# Patient Record
Sex: Female | Born: 2000 | Race: Black or African American | Hispanic: No | Marital: Single | Smoking: Current every day smoker
Health system: Southern US, Community
[De-identification: ages and names within clinical notes are randomized; demographics above are authoritative.]

## PROBLEM LIST (undated history)

## (undated) DIAGNOSIS — Z9109 Other allergy status, other than to drugs and biological substances: Secondary | ICD-10-CM

---
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
## 2020-04-25 ENCOUNTER — Other Ambulatory Visit: Payer: Self-pay

## 2020-04-25 ENCOUNTER — Encounter (HOSPITAL_COMMUNITY): Payer: Self-pay | Admitting: Emergency Medicine

## 2020-04-25 ENCOUNTER — Emergency Department (HOSPITAL_COMMUNITY)
Admission: EM | Admit: 2020-04-25 | Discharge: 2020-04-25 | Disposition: A | Discharge status: Home / Self Care | Payer: No Typology Code available for payment source | Attending: Emergency Medicine | Admitting: Emergency Medicine

## 2020-04-25 DIAGNOSIS — F17210 Nicotine dependence, cigarettes, uncomplicated: Secondary | ICD-10-CM | POA: Diagnosis not present

## 2020-04-25 DIAGNOSIS — R519 Headache, unspecified: Secondary | ICD-10-CM | POA: Insufficient documentation

## 2020-04-25 DIAGNOSIS — R55 Syncope and collapse: Secondary | ICD-10-CM | POA: Insufficient documentation

## 2020-04-25 LAB — BASIC METABOLIC PANEL
Anion gap: 11 (ref 5–15)
BUN: 11 mg/dL (ref 6–20)
CO2: 19 mmol/L — ABNORMAL LOW (ref 22–32)
Calcium: 9 mg/dL (ref 8.9–10.3)
Chloride: 108 mmol/L (ref 98–111)
Creatinine, Ser: 0.55 mg/dL (ref 0.44–1.00)
GFR calc Af Amer: >60 mL/min (ref >60)
GFR calc non Af Amer: >60 mL/min (ref >60)
Glucose, Bld: 86 mg/dL (ref 70–99)
Potassium: 3.8 mmol/L (ref 3.5–5.1)
Sodium: 138 mmol/L (ref 135–145)

## 2020-04-25 LAB — CBC
HCT: 37.7 % (ref 36.0–46.0)
Hemoglobin: 10.6 g/dL — ABNORMAL LOW (ref 12.0–15.0)
MCH: 24.5 pg — ABNORMAL LOW (ref 26.0–34.0)
MCHC: 28.1 g/dL — ABNORMAL LOW (ref 30.0–36.0)
MCV: 87.3 fL (ref 80.0–100.0)
Platelets: 312 10*3/uL (ref 150–400)
RBC: 4.32 MIL/uL (ref 3.87–5.11)
RDW: 18.6 % — ABNORMAL HIGH (ref 11.5–15.5)
WBC: 6.4 10*3/uL (ref 4.0–10.5)
nRBC: 0 % (ref 0.0–0.2)

## 2020-04-25 LAB — URINALYSIS, ROUTINE W REFLEX MICROSCOPIC
Bilirubin Urine: NEGATIVE
Glucose, UA: NEGATIVE mg/dL
Hgb urine dipstick: NEGATIVE
Ketones, ur: 5 mg/dL — AB
Leukocytes,Ua: NEGATIVE
Nitrite: NEGATIVE
Protein, ur: NEGATIVE mg/dL
Specific Gravity, Urine: 1.026 (ref 1.005–1.030)
pH: 6 (ref 5.0–8.0)

## 2020-04-25 LAB — CBG MONITORING, ED: Glucose-Capillary: 75 mg/dL (ref 70–99)

## 2020-04-25 LAB — HCG, QUANTITATIVE, PREGNANCY: hCG, Beta Chain, Quant, S: <1 m[IU]/mL (ref <5)

## 2020-04-25 MED ORDER — METOCLOPRAMIDE HCL 5 MG/ML IJ SOLN
10.0000 mg | Freq: Once | INTRAMUSCULAR | Status: AC
  Administered 2020-04-25: 21:00:00 10 mg via INTRAVENOUS
  Filled 2020-04-25: qty 2

## 2020-04-25 MED ORDER — DIPHENHYDRAMINE HCL 50 MG/ML IJ SOLN
25.0000 mg | Freq: Once | INTRAMUSCULAR | Status: AC
  Administered 2020-04-25: 21:00:00 25 mg via INTRAVENOUS
  Filled 2020-04-25: qty 1

## 2020-04-25 MED ORDER — SODIUM CHLORIDE 0.9 % IV BOLUS
1000.0000 mL | Freq: Once | INTRAVENOUS | Status: AC
  Administered 2020-04-25: 21:00:00 1000 mL via INTRAVENOUS

## 2020-04-25 NOTE — ED Triage Notes (Signed)
Patient reports headache today with LOC at restaurant. Denies fall. Denies N/V/D.

## 2020-04-25 NOTE — Discharge Instructions (Addendum)
It was our pleasure to provide your ER care today - we hope that you feel better.  Rest. Drink plenty of fluids.   Take acetaminophen as need.   Follow up with primary care doctor in the coming week.  Return to ER if worse, new symptoms, fevers, new or severe pain, persistent vomiting, trouble breathing, weak/fainting, or other concern.

## 2020-04-25 NOTE — ED Provider Notes (Signed)
[facility]-EMERGENCY DEPT Provider Note   CSN: [id number] Arrival date & time: 04/25/20  1641     History Chief Complaint  Patient presents with  . Loss of Consciousness  . Headache    Katherine Dawson is a 19 y.o. female who presents to ED with a chief complaint of syncope and headache.  States that she was at [employer] with her family.  When she was standing up at the bar, felt like she suddenly was warm, felt lightheaded.  States that the woman next to her was asking her if she was okay.  She believes that she passed out for several seconds as she woke up with the woman holding on to her.  She states that she did not fall to the floor.  Since then she has had a headache.  She reports history of headaches in the past when she is overworked.  She had normal meal today.  She denies any new medications or changes to her lifestyle.  Denies history of similar symptoms in the past.  She has not taken any medications to help with her headache.  She denies any vision changes, numbness in arms or legs, vomiting, diarrhea, chest pain, shortness of breath, leg swelling, recent immobilization, personal or family history of aneurysms.  HPI     Past Medical History:  Diagnosis Date  . Pollen allergies     There are no problems to display for this patient.   History reviewed. No pertinent surgical history.   OB History   No obstetric history on file.     No family history on file.  Social History   Tobacco Use  . Smoking status: Current Every Day Smoker    Types: Cigarettes  . Smokeless tobacco: Never Used  Vaping Use  . Vaping Use: Never used  Substance Use Topics  . Alcohol use: No  . Drug use: No    Home Medications Prior to Admission medications   Medication Sig Start Date End Date Taking? Authorizing Provider  diphenhydrAMINE (BENADRYL) 25 MG tablet Take 1 tablet (25 mg total) by mouth every 6 (six) hours. 06/26/18   Roxy Horseman, PA-C    famotidine (PEPCID) 20 MG tablet Take 1 tablet (20 mg total) by mouth 2 (two) times daily. 06/26/18   Roxy Horseman, PA-C    Allergies    Patient has no known allergies.  Review of Systems   Review of Systems  Constitutional: Negative for appetite change, chills and fever.  HENT: Negative for ear pain, rhinorrhea, sneezing and sore throat.   Eyes: Negative for photophobia and visual disturbance.  Respiratory: Negative for cough, chest tightness, shortness of breath and wheezing.   Cardiovascular: Negative for chest pain and palpitations.  Gastrointestinal: Negative for abdominal pain, blood in stool, constipation, diarrhea, nausea and vomiting.  Genitourinary: Negative for dysuria, hematuria and urgency.  Musculoskeletal: Negative for myalgias.  Skin: Negative for rash.  Neurological: Positive for syncope, light-headedness and headaches. Negative for dizziness and weakness.    Physical Exam Updated Vital Signs BP 122/65 (BP Location: Right Arm)   Pulse 74   Temp 97.9 F (36.6 C) (Oral)   Resp 18   LMP 04/11/2020   SpO2 100%   Physical Exam Vitals and nursing note reviewed.  Constitutional:      General: She is not in acute distress.    Appearance: She is well-developed.  HENT:     Head: Normocephalic and atraumatic.     Nose: Nose normal.  Eyes:     General: No scleral icterus.       Right eye: No discharge.        Left eye: No discharge.     Conjunctiva/sclera: Conjunctivae normal.     Pupils: Pupils are equal, round, and reactive to light.  Neck:     Comments: No meningeal signs. Cardiovascular:     Rate and Rhythm: Normal rate and regular rhythm.     Heart sounds: Normal heart sounds. No murmur heard.  No friction rub. No gallop.   Pulmonary:     Effort: Pulmonary effort is normal. No respiratory distress.     Breath sounds: Normal breath sounds.  Abdominal:     General: Bowel sounds are normal. There is no distension.     Palpations: Abdomen is soft.      Tenderness: There is no abdominal tenderness. There is no guarding.  Musculoskeletal:        General: Normal range of motion.     Cervical back: Normal range of motion and neck supple.     Comments: No lower extremity edema, erythema or calf tenderness bilaterally.  Skin:    General: Skin is warm and dry.     Findings: No rash.  Neurological:     General: No focal deficit present.     Mental Status: She is alert and oriented to person, place, and time.     Cranial Nerves: No cranial nerve deficit.     Sensory: No sensory deficit.     Motor: No weakness or abnormal muscle tone.     Coordination: Coordination normal.     Comments: Pupils reactive. No facial asymmetry noted. Cranial nerves appear grossly intact. Sensation intact to light touch on face, BUE and BLE. Strength 5/5 in BUE and BLE. Normal finger to nose coordination bilaterally.     ED Results / Procedures / Treatments   Labs (all labs ordered are listed, but only abnormal results are displayed) Labs Reviewed  BASIC METABOLIC PANEL - Abnormal; Notable for the following components:      Result Value   CO2 19 (*)    All other components within normal limits  CBC - Abnormal; Notable for the following components:   Hemoglobin 10.6 (*)    MCH 24.5 (*)    MCHC 28.1 (*)    RDW 18.6 (*)    All other components within normal limits  HCG, QUANTITATIVE, PREGNANCY  URINALYSIS, ROUTINE W REFLEX MICROSCOPIC  CBG MONITORING, ED  CBG MONITORING, ED    EKG None  Radiology No results found.  Procedures Procedures (including critical care time)  Medications Ordered in ED Medications  sodium chloride 0.9 % bolus 1,000 mL (1,000 mLs Intravenous New Bag/Given 04/25/20 2040)  metoCLOPramide (REGLAN) injection 10 mg (10 mg Intravenous Given 04/25/20 2041)  diphenhydrAMINE (BENADRYL) injection 25 mg (25 mg Intravenous Given 04/25/20 2041)    ED Course  I have reviewed the triage vital signs and the nursing notes.  Pertinent  labs & imaging results that were available during my care of the patient were reviewed by me and considered in my medical decision making (see chart for details).    MDM Rules/Calculators/A&P                          18yo F presents to the ED with a syncopal episode for a few seconds with a subsequent headache that occurred prior to arrival.  She was eating lunch with her  family when the episode happened.  States that she did not hit the floor.  She has not tried anything for her headache. On exam she is overall well-appearing.  No deficits neurological exam noted.  No numbness or weakness noted, no facial asymmetry noted.  No meningeal signs noted.  No lower extremity edema, erythema or calf tenderness do not concern me for DVT. EKG shows sinus rhythm. Labwork ordered and interpreted; CBC with hemoglobin at baseline.  BMP is unremarkable.  hCG is negative.  Will give IV fluids, migraine cocktail and reassess.  Discussed possibility of imaging with patient but she states that she would like to see how she feels after treatment.  Care handed off to Dr. Ashok Cordia pending recheck after medications. Patient will likely be discharged home if her symptoms improve and her workup is reassuring.   Portions of this note were generated with [employer]. Dictation errors may occur despite best attempts at proofreading.  Final Clinical Impression(s) / ED Diagnoses Final diagnoses:  Syncope and collapse    Rx / DC Orders ED Discharge Orders    None       Delia Heady, Hershal Coria 04/25/20 2051    Lajean Saver, MD 04/25/20 2200

## 2022-04-17 ENCOUNTER — Emergency Department (HOSPITAL_COMMUNITY): Payer: Self-pay

## 2022-04-17 ENCOUNTER — Encounter (HOSPITAL_COMMUNITY): Payer: Self-pay

## 2022-04-17 ENCOUNTER — Emergency Department (HOSPITAL_COMMUNITY)
Admission: EM | Admit: 2022-04-17 | Discharge: 2022-04-18 | Disposition: A | Discharge status: Home / Self Care | Payer: Self-pay | Attending: Emergency Medicine | Admitting: Emergency Medicine

## 2022-04-17 DIAGNOSIS — R102 Pelvic and perineal pain: Secondary | ICD-10-CM | POA: Insufficient documentation

## 2022-04-17 DIAGNOSIS — N939 Abnormal uterine and vaginal bleeding, unspecified: Secondary | ICD-10-CM | POA: Insufficient documentation

## 2022-04-17 DIAGNOSIS — F17200 Nicotine dependence, unspecified, uncomplicated: Secondary | ICD-10-CM | POA: Insufficient documentation

## 2022-04-17 LAB — COMPREHENSIVE METABOLIC PANEL
ALT: 14 U/L (ref 0–44)
AST: 17 U/L (ref 15–41)
Albumin: 3.7 g/dL (ref 3.5–5.0)
Alkaline Phosphatase: 49 U/L (ref 38–126)
Anion gap: 6 (ref 5–15)
BUN: 6 mg/dL (ref 6–20)
CO2: 22 mmol/L (ref 22–32)
Calcium: 9 mg/dL (ref 8.9–10.3)
Chloride: 105 mmol/L (ref 98–111)
Creatinine, Ser: 0.59 mg/dL (ref 0.44–1.00)
GFR, Estimated: >60 mL/min (ref >60)
Glucose, Bld: 97 mg/dL (ref 70–99)
Potassium: 3.6 mmol/L (ref 3.5–5.1)
Sodium: 133 mmol/L — ABNORMAL LOW (ref 135–145)
Total Bilirubin: 0.4 mg/dL (ref 0.3–1.2)
Total Protein: 7.7 g/dL (ref 6.5–8.1)

## 2022-04-17 LAB — CBC
HCT: 33.3 % — ABNORMAL LOW (ref 36.0–46.0)
Hemoglobin: 10.4 g/dL — ABNORMAL LOW (ref 12.0–15.0)
MCH: 26.1 pg (ref 26.0–34.0)
MCHC: 31.2 g/dL (ref 30.0–36.0)
MCV: 83.7 fL (ref 80.0–100.0)
Platelets: 307 10*3/uL (ref 150–400)
RBC: 3.98 MIL/uL (ref 3.87–5.11)
RDW: 16.7 % — ABNORMAL HIGH (ref 11.5–15.5)
WBC: 7.3 10*3/uL (ref 4.0–10.5)
nRBC: 0 % (ref 0.0–0.2)

## 2022-04-17 LAB — I-STAT BETA HCG BLOOD, ED (MC, WL, AP ONLY): I-stat hCG, quantitative: <5 m[IU]/mL (ref <5)

## 2022-04-17 NOTE — ED Provider Triage Note (Signed)
Emergency Medicine Provider Triage Evaluation Note  Katherine Dawson , a 21 y.o. female  was evaluated in triage.  Pt complains of vaginal bleeding x 2 weeks. Going through pad every hour. Associated pelvic pain. LMP about 1 month ago. .  Review of Systems  Positive: Vaginal bleeding, pelvic pain Negative: Fever, vomiting, dysuria  Physical Exam  BP 131/76 (BP Location: Right Arm)   Pulse 75   Temp 98.3 F (36.8 C) (Oral)   Resp 18   LMP 04/17/2022   SpO2 97%  Gen:   Awake, no distress   Resp:  Normal effort  MSK:   Moves extremities without difficulty  Other:  No peritoneal signs.   Medical Decision Making  Medically screening exam initiated at 10:25 PM.  Appropriate orders placed.  Katherine Dawson was informed that the remainder of the evaluation will be completed by another provider, this initial triage assessment does not replace that evaluation, and the importance of remaining in the ED until their evaluation is complete.  Vaginal bleeding.    Katherine Dawson, [place] 04/17/22 2226

## 2022-04-17 NOTE — ED Triage Notes (Signed)
Pt reports heavy bleeding & abd pain. Pt reports the bleeding started x2 weeks ago. Pt reports she went to health department and they recommend she comes to the ER

## 2022-04-18 LAB — URINALYSIS, ROUTINE W REFLEX MICROSCOPIC
Bacteria, UA: NONE SEEN
Bilirubin Urine: NEGATIVE
Glucose, UA: NEGATIVE mg/dL
Ketones, ur: NEGATIVE mg/dL
Leukocytes,Ua: NEGATIVE
Nitrite: NEGATIVE
Protein, ur: NEGATIVE mg/dL
Specific Gravity, Urine: 1.017 (ref 1.005–1.030)
pH: 6 (ref 5.0–8.0)

## 2022-04-18 LAB — HEMOGLOBIN AND HEMATOCRIT, BLOOD
HCT: 33.4 % — ABNORMAL LOW (ref 36.0–46.0)
Hemoglobin: 10.4 g/dL — ABNORMAL LOW (ref 12.0–15.0)

## 2022-04-18 MED ORDER — SODIUM CHLORIDE 0.9 % IV BOLUS
1000.0000 mL | Freq: Once | INTRAVENOUS | Status: AC
  Administered 2022-04-18: 1000 mL via INTRAVENOUS

## 2022-04-18 MED ORDER — SODIUM CHLORIDE 0.9 % IV SOLN: INTRAVENOUS | Status: DC

## 2022-04-18 NOTE — ED Provider Notes (Signed)
[facility] Provider Note   CSN: [id number] Arrival date & time: 04/17/22  2214     History  Chief Complaint  Patient presents with   Abdominal Pain    Katherine Dawson is a 21 y.o. female.  Patient been having heavy vaginal bleeding for about 2 weeks was been heavier for about a week with clots.  She went to health department they recommend she come to the emergency department.  Patient has an implanted birth control.  Patient has not had difficulties with heavy bleeding in the past.  Patient without any lightheadedness or feeling like she is going to pass out no shortness of breath no chest pain.  Past medical history negative patient is an everyday smoker.       Home Medications Prior to Admission medications   Medication Sig Start Date End Date Taking? Authorizing Provider  acetaminophen (TYLENOL) 500 MG tablet Take 1,000 mg by mouth every 6 (six) hours as needed for moderate pain or headache.   Yes [provider]  diphenhydrAMINE (BENADRYL) 25 MG tablet Take 1 tablet (25 mg total) by mouth every 6 (six) hours. Patient not taking: Reported on 04/18/2022 06/26/18   Roxy HorsemanBrowning, Robert, PA-C  famotidine (PEPCID) 20 MG tablet Take 1 tablet (20 mg total) by mouth 2 (two) times daily. Patient not taking: Reported on 04/18/2022 06/26/18   Roxy HorsemanBrowning, Robert, PA-C      Allergies    Patient has no known allergies.    Review of Systems   Review of Systems  Constitutional:  Negative for chills and fever.  HENT:  Negative for ear pain and sore throat.   Eyes:  Negative for pain and visual disturbance.  Respiratory:  Negative for cough and shortness of breath.   Cardiovascular:  Negative for chest pain and palpitations.  Gastrointestinal:  Negative for abdominal pain and vomiting.  Genitourinary:  Positive for vaginal bleeding. Negative for dysuria and hematuria.  Musculoskeletal:  Negative for arthralgias and back pain.  Skin:  Negative  for color change and rash.  Neurological:  Negative for seizures and syncope.  All other systems reviewed and are negative.   Physical Exam Updated Vital Signs BP 107/65   Pulse 77   Temp 98.3 F (36.8 C)   Resp 16   LMP 04/17/2022   SpO2 100%  Physical Exam Vitals and nursing note reviewed.  Constitutional:      General: She is not in acute distress.    Appearance: She is well-developed.  HENT:     Head: Normocephalic and atraumatic.  Eyes:     Conjunctiva/sclera: Conjunctivae normal.  Cardiovascular:     Rate and Rhythm: Normal rate and regular rhythm.     Heart sounds: No murmur heard. Pulmonary:     Effort: Pulmonary effort is normal. No respiratory distress.     Breath sounds: Normal breath sounds.  Abdominal:     General: Bowel sounds are normal.     Palpations: Abdomen is soft.     Tenderness: There is no abdominal tenderness.     Hernia: No hernia is present.  Musculoskeletal:        General: No swelling.     Cervical back: Neck supple.  Skin:    General: Skin is warm and dry.     Capillary Refill: Capillary refill takes less than 2 seconds.  Neurological:     General: No focal deficit present.     Mental Status: She is alert.  Psychiatric:  Mood and Affect: Mood normal.     ED Results / Procedures / Treatments   Labs (all labs ordered are listed, but only abnormal results are displayed) Labs Reviewed  COMPREHENSIVE METABOLIC PANEL - Abnormal; Notable for the following components:      Result Value   Sodium 133 (*)    All other components within normal limits  CBC - Abnormal; Notable for the following components:   Hemoglobin 10.4 (*)    HCT 33.3 (*)    RDW 16.7 (*)    All other components within normal limits  URINALYSIS, ROUTINE W REFLEX MICROSCOPIC - Abnormal; Notable for the following components:   Hgb urine dipstick SMALL (*)    All other components within normal limits  HEMOGLOBIN AND HEMATOCRIT, BLOOD - Abnormal; Notable for the  following components:   Hemoglobin 10.4 (*)    HCT 33.4 (*)    All other components within normal limits  I-STAT BETA HCG BLOOD, ED ([facility], [facility], [facility] ONLY)    EKG None  Radiology US Pelvis Complete  Result Date: 04/18/2022 CLINICAL DATA:  Initial evaluation for acute pelvic pain, vaginal bleeding. EXAM: TRANSABDOMINAL AND TRANSVAGINAL ULTRASOUND OF PELVIS DOPPLER ULTRASOUND OF OVARIES TECHNIQUE: Both transabdominal and transvaginal ultrasound examinations of the pelvis were performed. Transabdominal technique was performed for global imaging of the pelvis including uterus, ovaries, adnexal regions, and pelvic cul-de-sac. It was necessary to proceed with endovaginal exam following the transabdominal exam to visualize the endometrium and ovaries. Color and duplex Doppler ultrasound was utilized to evaluate blood flow to the ovaries. COMPARISON:  None Available. FINDINGS: Uterus Measurements: 6.6 x 3.1 x 3.9 cm = volume: 42.6 mL. Uterus is anteverted. No discrete fibroid or other mass. Increased vascularity seen diffusely throughout the myometrium. Endometrium Thickness: 2.2 mm.  No focal abnormality visualized. Right ovary Measurements: 3.3 x 2.3 x 2.6 cm = volume: 10.3 mL. Normal appearance/no adnexal mass. Left ovary Measurements: 3.2 x 2.0 x 2.5 cm = volume: 8.1 mL. Normal appearance/no adnexal mass. Pulsed Doppler evaluation of both ovaries demonstrates normal low-resistance arterial and venous waveforms. Other findings No abnormal free fluid. IMPRESSION: 1. Negative pelvic ultrasound with no evidence for torsion or other acute abnormality. 2. Endometrial stripe within normal limits measuring 2.2 mm in thickness. If bleeding remains unresponsive to hormonal or medical therapy, sonohysterogram should be considered for focal lesion work-up. (Ref: Radiological Reasoning: Algorithmic Workup of Abnormal Vaginal Bleeding with Endovaginal Sonography and Sonohysterography. AJR 2008; 785:Y85-02). Electronically  Signed   By: Rise Mu M.D.   On: 04/18/2022 00:35   US Transvaginal Non-OB  Result Date: 04/18/2022 CLINICAL DATA:  Initial evaluation for acute pelvic pain, vaginal bleeding. EXAM: TRANSABDOMINAL AND TRANSVAGINAL ULTRASOUND OF PELVIS DOPPLER ULTRASOUND OF OVARIES TECHNIQUE: Both transabdominal and transvaginal ultrasound examinations of the pelvis were performed. Transabdominal technique was performed for global imaging of the pelvis including uterus, ovaries, adnexal regions, and pelvic cul-de-sac. It was necessary to proceed with endovaginal exam following the transabdominal exam to visualize the endometrium and ovaries. Color and duplex Doppler ultrasound was utilized to evaluate blood flow to the ovaries. COMPARISON:  None Available. FINDINGS: Uterus Measurements: 6.6 x 3.1 x 3.9 cm = volume: 42.6 mL. Uterus is anteverted. No discrete fibroid or other mass. Increased vascularity seen diffusely throughout the myometrium. Endometrium Thickness: 2.2 mm.  No focal abnormality visualized. Right ovary Measurements: 3.3 x 2.3 x 2.6 cm = volume: 10.3 mL. Normal appearance/no adnexal mass. Left ovary Measurements: 3.2 x 2.0 x 2.5 cm = volume:  8.1 mL. Normal appearance/no adnexal mass. Pulsed Doppler evaluation of both ovaries demonstrates normal low-resistance arterial and venous waveforms. Other findings No abnormal free fluid. IMPRESSION: 1. Negative pelvic ultrasound with no evidence for torsion or other acute abnormality. 2. Endometrial stripe within normal limits measuring 2.2 mm in thickness. If bleeding remains unresponsive to hormonal or medical therapy, sonohysterogram should be considered for focal lesion work-up. (Ref: Radiological Reasoning: Algorithmic Workup of Abnormal Vaginal Bleeding with Endovaginal Sonography and Sonohysterography. AJR 2008; 098:J19-14). Electronically Signed   By: Rise Mu M.D.   On: 04/18/2022 00:35   [place] Art/Ven Flow Abd Pelv Doppler  Result Date:  04/18/2022 CLINICAL DATA:  Initial evaluation for acute pelvic pain, vaginal bleeding. EXAM: TRANSABDOMINAL AND TRANSVAGINAL ULTRASOUND OF PELVIS DOPPLER ULTRASOUND OF OVARIES TECHNIQUE: Both transabdominal and transvaginal ultrasound examinations of the pelvis were performed. Transabdominal technique was performed for global imaging of the pelvis including uterus, ovaries, adnexal regions, and pelvic cul-de-sac. It was necessary to proceed with endovaginal exam following the transabdominal exam to visualize the endometrium and ovaries. Color and duplex Doppler ultrasound was utilized to evaluate blood flow to the ovaries. COMPARISON:  None Available. FINDINGS: Uterus Measurements: 6.6 x 3.1 x 3.9 cm = volume: 42.6 mL. Uterus is anteverted. No discrete fibroid or other mass. Increased vascularity seen diffusely throughout the myometrium. Endometrium Thickness: 2.2 mm.  No focal abnormality visualized. Right ovary Measurements: 3.3 x 2.3 x 2.6 cm = volume: 10.3 mL. Normal appearance/no adnexal mass. Left ovary Measurements: 3.2 x 2.0 x 2.5 cm = volume: 8.1 mL. Normal appearance/no adnexal mass. Pulsed Doppler evaluation of both ovaries demonstrates normal low-resistance arterial and venous waveforms. Other findings No abnormal free fluid. IMPRESSION: 1. Negative pelvic ultrasound with no evidence for torsion or other acute abnormality. 2. Endometrial stripe within normal limits measuring 2.2 mm in thickness. If bleeding remains unresponsive to hormonal or medical therapy, sonohysterogram should be considered for focal lesion work-up. (Ref: Radiological Reasoning: Algorithmic Workup of Abnormal Vaginal Bleeding with Endovaginal Sonography and Sonohysterography. AJR 2008; 782:N56-21). Electronically Signed   By: Rise Mu M.D.   On: 04/18/2022 00:35    Procedures Procedures    Medications Ordered in ED Medications  0.9 %  sodium chloride infusion (has no administration in time range)  sodium  chloride 0.9 % bolus 1,000 mL (1,000 mLs Intravenous New Bag/Given 04/18/22 3086)    ED Course/ Medical Decision Making/ A&P                           Medical Decision Making Amount and/or Complexity of Data Reviewed Labs: ordered.  Risk Prescription drug management.   Patient pregnancy test negative.  Urinalysis not consistent with urinary tract infection.  Complete metabolic panel normal including liver function test.  Patient CBC which was done last evening had a hemoglobin of 10.4 white count 7.3 platelets are normal.  Patient had ultrasound done of the pelvis and also transvaginal ultrasound which was negative with no evidence of torsion no acute abnormality.  Endometrial stripe was within normal limits measuring 2.2 mm in thickness.  Their recommendation if bleeding remains unresponsive to hormonal or medical therapy that a should be considered for focal lesion work-up.  Patient nontoxic no acute distress.  No tachycardia blood pressures were a little on the soft side initially she came in at 131/76 and then we work on getting 94/53.  Patient when I saw her was like 94/49.  Again no tachycardia.  Patient  given 1 L of lactated Ringer's.  H&H repeated and there was no change in the hemoglobin.  Hemoglobin was still 10.4.  And patient's systolic blood pressures are consistently been over 100.  Patient remains in no acute distress nontoxic.  Patient does not have GYN follow-up.  Patient does not have regular periods because of the implantable birth control.  We will give patient follow-up with OB/GYN.  Final Clinical Impression(s) / ED Diagnoses Final diagnoses:  Excessive vaginal bleeding    Rx / DC Orders ED Discharge Orders     None         Vanetta Mulders, MD 04/18/22 1105

## 2022-04-18 NOTE — Discharge Instructions (Signed)
Make an appointment to follow-up with [facility].  Return for any new or worse symptoms return for feeling like you are going to pass out or feeling lightheaded.  Today's labs without any significant abnormalities.  Pregnancy test negative and hemoglobin has been very stable.

## 2022-04-18 NOTE — ED Notes (Addendum)
Pt seen walking out of lobby with another pt in the lobby. Pt seen headed towards parking lot. Pt informed, multiple times, that if they are not in lobby or in sight of staff when there names are called then they will be pulled OTF.

## 2022-10-13 ENCOUNTER — Emergency Department (HOSPITAL_COMMUNITY)
Admission: EM | Admit: 2022-10-13 | Discharge: 2022-10-13 | Disposition: A | Discharge status: Home / Self Care | Payer: Commercial Managed Care - HMO | Attending: Emergency Medicine | Admitting: Emergency Medicine

## 2022-10-13 ENCOUNTER — Other Ambulatory Visit: Payer: Self-pay

## 2022-10-13 DIAGNOSIS — R1012 Left upper quadrant pain: Secondary | ICD-10-CM | POA: Insufficient documentation

## 2022-10-13 DIAGNOSIS — R109 Unspecified abdominal pain: Secondary | ICD-10-CM

## 2022-10-13 LAB — LIPASE, BLOOD: Lipase: 49 U/L (ref 11–51)

## 2022-10-13 LAB — CBC
HCT: 32.8 % — ABNORMAL LOW (ref 36.0–46.0)
Hemoglobin: 10.4 g/dL — ABNORMAL LOW (ref 12.0–15.0)
MCH: 26.3 pg (ref 26.0–34.0)
MCHC: 31.7 g/dL (ref 30.0–36.0)
MCV: 82.8 fL (ref 80.0–100.0)
Platelets: 231 10*3/uL (ref 150–400)
RBC: 3.96 MIL/uL (ref 3.87–5.11)
RDW: 17.4 % — ABNORMAL HIGH (ref 11.5–15.5)
WBC: 6.6 10*3/uL (ref 4.0–10.5)
nRBC: 0 % (ref 0.0–0.2)

## 2022-10-13 LAB — URINALYSIS, ROUTINE W REFLEX MICROSCOPIC
Bilirubin Urine: NEGATIVE
Glucose, UA: NEGATIVE mg/dL
Hgb urine dipstick: NEGATIVE
Ketones, ur: NEGATIVE mg/dL
Leukocytes,Ua: NEGATIVE
Nitrite: NEGATIVE
Protein, ur: NEGATIVE mg/dL
Specific Gravity, Urine: 1.019 (ref 1.005–1.030)
pH: 5 (ref 5.0–8.0)

## 2022-10-13 LAB — COMPREHENSIVE METABOLIC PANEL
ALT: 12 U/L (ref 0–44)
AST: 16 U/L (ref 15–41)
Albumin: 3.8 g/dL (ref 3.5–5.0)
Alkaline Phosphatase: 47 U/L (ref 38–126)
Anion gap: 7 (ref 5–15)
BUN: 14 mg/dL (ref 6–20)
CO2: 23 mmol/L (ref 22–32)
Calcium: 9.3 mg/dL (ref 8.9–10.3)
Chloride: 106 mmol/L (ref 98–111)
Creatinine, Ser: 0.65 mg/dL (ref 0.44–1.00)
GFR, Estimated: >60 mL/min (ref >60)
Glucose, Bld: 91 mg/dL (ref 70–99)
Potassium: 4.3 mmol/L (ref 3.5–5.1)
Sodium: 136 mmol/L (ref 135–145)
Total Bilirubin: 0.3 mg/dL (ref 0.3–1.2)
Total Protein: 7.1 g/dL (ref 6.5–8.1)

## 2022-10-13 LAB — I-STAT BETA HCG BLOOD, ED (MC, WL, AP ONLY): I-stat hCG, quantitative: <5 m[IU]/mL (ref <5)

## 2022-10-13 MED ORDER — ALUM & MAG HYDROXIDE-SIMETH 200-200-20 MG/5ML PO SUSP
30.0000 mL | Freq: Once | ORAL | Status: AC
  Administered 2022-10-13: 30 mL via ORAL
  Filled 2022-10-13: qty 30

## 2022-10-13 MED ORDER — HYOSCYAMINE SULFATE 0.125 MG SL SUBL
0.1250 mg | SUBLINGUAL_TABLET | Freq: Once | SUBLINGUAL | Status: AC
  Administered 2022-10-13: 0.125 mg via ORAL
  Filled 2022-10-13: qty 1

## 2022-10-13 MED ORDER — LIDOCAINE VISCOUS HCL 2 % MT SOLN
15.0000 mL | Freq: Once | OROMUCOSAL | Status: AC
  Administered 2022-10-13: 15 mL via ORAL
  Filled 2022-10-13: qty 15

## 2022-10-13 NOTE — ED Notes (Signed)
Patient verbalizes understanding of discharge instructions. Opportunity for questioning and answers were provided. Armband removed by staff, pt discharged from ED. Ambulated out to lobby  

## 2022-10-13 NOTE — ED Provider Notes (Signed)
[facility] EMERGENCY DEPARTMENT Provider Note  CSN: [id number] Arrival date & time: 10/13/22 0224  Chief Complaint(s) Abdominal Pain  HPI Katherine Dawson is a 21 y.o. female     Abdominal Pain Pain location:  Epigastric and LUQ Pain quality: aching   Pain radiation: migrating to lower abd. Pain severity:  Moderate Onset quality:  Gradual Duration:  4 hours Timing:  Intermittent Progression:  Waxing and waning Chronicity:  New Relieved by:  Nothing Worsened by:  Nothing Associated symptoms: no chest pain, no chills, no fever, no nausea and no vomiting     Past Medical History Past Medical History:  Diagnosis Date   Pollen allergies    There are no problems to display for this patient.  Home Medication(s) Prior to Admission medications   Medication Sig Start Date End Date Taking? Authorizing Provider  acetaminophen (TYLENOL) 500 MG tablet Take 1,000 mg by mouth every 6 (six) hours as needed for moderate pain or headache.    [provider]  diphenhydrAMINE (BENADRYL) 25 MG tablet Take 1 tablet (25 mg total) by mouth every 6 (six) hours. Patient not taking: Reported on 04/18/2022 06/26/18   Montine Circle, PA-C  famotidine (PEPCID) 20 MG tablet Take 1 tablet (20 mg total) by mouth 2 (two) times daily. Patient not taking: Reported on 04/18/2022 06/26/18   Montine Circle, PA-C                                                                                                                                    Allergies Patient has no known allergies.  Review of Systems Review of Systems  Constitutional:  Negative for chills and fever.  Cardiovascular:  Negative for chest pain.  Gastrointestinal:  Positive for abdominal pain. Negative for nausea and vomiting.   As noted in HPI  Physical Exam Vital Signs  I have reviewed the triage vital signs BP 108/63   Pulse 64   Temp 98.1 F (36.7 C)   Resp 18   LMP 09/15/2022 (Exact Date)   SpO2 100%    Physical Exam Vitals reviewed.  Constitutional:      General: She is not in acute distress.    Appearance: She is well-developed. She is not diaphoretic.  HENT:     Head: Normocephalic and atraumatic.     Right Ear: External ear normal.     Left Ear: External ear normal.     Nose: Nose normal.  Eyes:     General: No scleral icterus.    Conjunctiva/sclera: Conjunctivae normal.  Neck:     Trachea: Phonation normal.  Cardiovascular:     Rate and Rhythm: Normal rate and regular rhythm.  Pulmonary:     Effort: Pulmonary effort is normal. No respiratory distress.     Breath sounds: No stridor.  Abdominal:     General: There is no distension.     Tenderness: There is abdominal tenderness  in the right lower quadrant, epigastric area, periumbilical area and left upper quadrant. There is no guarding or rebound. Negative signs include Murphy's sign and McBurney's sign.  Musculoskeletal:        General: Normal range of motion.     Cervical back: Normal range of motion.  Neurological:     Mental Status: She is alert and oriented to person, place, and time.  Psychiatric:        Behavior: Behavior normal.     ED Results and Treatments Labs (all labs ordered are listed, but only abnormal results are displayed) Labs Reviewed  CBC - Abnormal; Notable for the following components:      Result Value   Hemoglobin 10.4 (*)    HCT 32.8 (*)    RDW 17.4 (*)    All other components within normal limits  LIPASE, BLOOD  COMPREHENSIVE METABOLIC PANEL  URINALYSIS, ROUTINE W REFLEX MICROSCOPIC  I-STAT BETA HCG BLOOD, ED ([facility], [facility], [facility] ONLY)                                                                                                                         EKG  EKG Interpretation  Date/Time:    Ventricular Rate:    PR Interval:    QRS Duration:   QT Interval:    QTC Calculation:   R Axis:     Text Interpretation:         Radiology No results found.  Medications Ordered in  ED Medications  alum & mag hydroxide-simeth (MAALOX/MYLANTA) 200-200-20 MG/5ML suspension 30 mL (30 mLs Oral Given 10/13/22 0436)    And  lidocaine (XYLOCAINE) 2 % viscous mouth solution 15 mL (15 mLs Oral Given 10/13/22 0436)  hyoscyamine (LEVSIN SL) SL tablet 0.125 mg (0.125 mg Oral Given 10/13/22 0436)                                                                                                                                     Procedures Procedures  (including critical care time)  Medical Decision Making / ED Course   Medical Decision Making Amount and/or Complexity of Data Reviewed Labs: ordered. Decision-making details documented in ED Course.  Risk OTC drugs. Prescription drug management.    Differential includes gastritis, pancreatitis, biliary disease.  Also considering functional intestinal cramping/bloating.  With the lower abdominal discomfort, will rule out evidence of appendicitis though I have  low suspicion for this.  Will also assess for UTI and pregnancy related process.  Presentation is not concerning for torsion, PID.  CBC without leukocytosis.  Stable hemoglobin.  CMP without significant electrolyte derangements or renal sufficiency.  No evidence of bili obstruction or pancreatitis.  hCG negative.  UA without evidence of infection.  Improved after GI cocktail. Tolerated PO       Final Clinical Impression(s) / ED Diagnoses Final diagnoses:  Abdominal discomfort   The patient appears reasonably screened and/or stabilized for discharge and I doubt any other medical condition or other [facility] requiring further screening, evaluation, or treatment in the ED at this time. I have discussed the findings, Dx and Tx plan with the patient/family who expressed understanding and agree(s) with the plan. Discharge instructions discussed at length. The patient/family was given strict return precautions who verbalized understanding of the instructions. No further questions at  time of discharge.  Disposition: Discharge  Condition: Good  ED Discharge Orders     None        Follow Up: Primary care provider  Call  to schedule an appointment for close follow up           This chart was dictated using voice recognition software.  Despite best efforts to proofread,  errors can occur which can change the documentation meaning.    Fatima Blank, MD 10/13/22 [phone]

## 2022-10-13 NOTE — ED Triage Notes (Signed)
Patient reports pain/discomfort across upper abdomen with breasts tenderness onset yesterday , denies emesis or diarrhea , no fever or chills .

## 2023-07-09 IMAGING — US US PELVIS COMPLETE
1 series · 13 of 25 positions shown · non-contrast
Comparison: None Available.

CLINICAL DATA: Initial evaluation for acute pelvic pain, vaginal
bleeding.

EXAM:
TRANSABDOMINAL AND TRANSVAGINAL ULTRASOUND OF PELVIS
DOPPLER ULTRASOUND OF OVARIES
TECHNIQUE: Both transabdominal and transvaginal ultrasound examinations of the
pelvis were performed. Transabdominal technique was performed for
global imaging of the pelvis including uterus, ovaries, adnexal
regions, and pelvic cul-de-sac.
It was necessary to proceed with endovaginal exam following the
transabdominal exam to visualize the endometrium and ovaries. Color
and duplex Doppler ultrasound was utilized to evaluate blood flow to
the ovaries.

[Series 1: us pelvis (transabdominal only) · 13 of 143 slices shown]
[im 1/143]
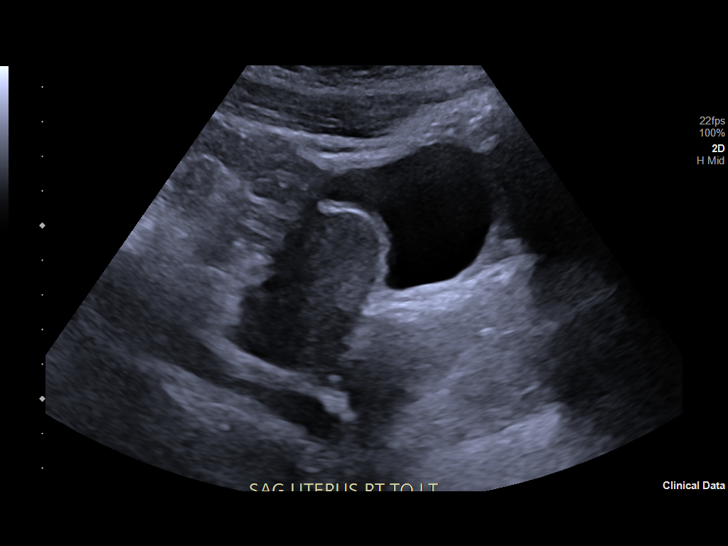
[im 12/143]
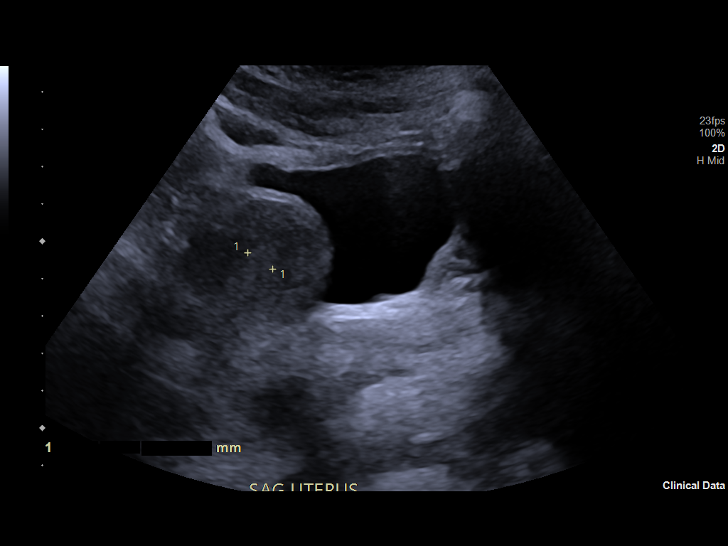
[im 24/143]
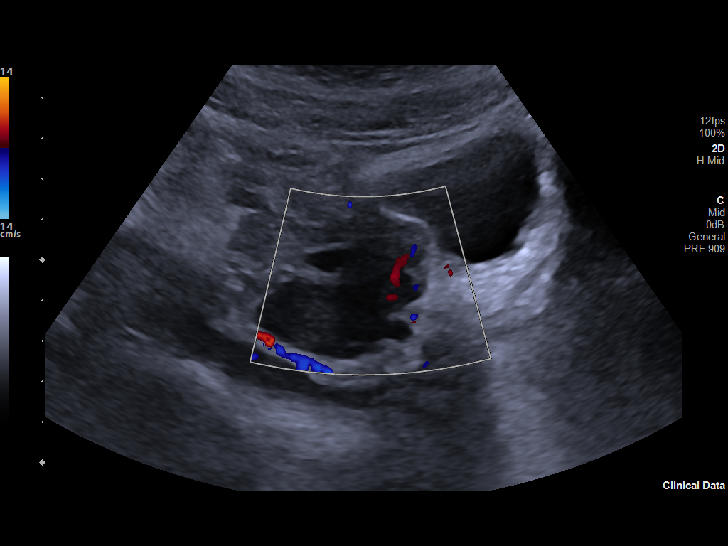
[im 36/143]
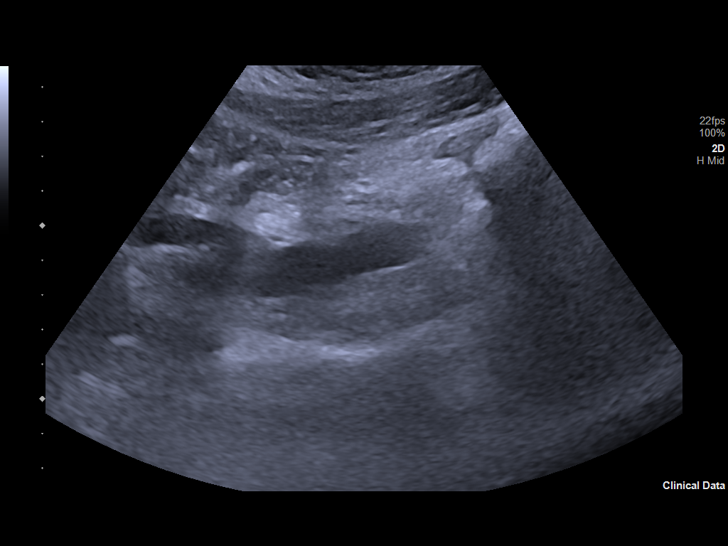
[im 48/143]
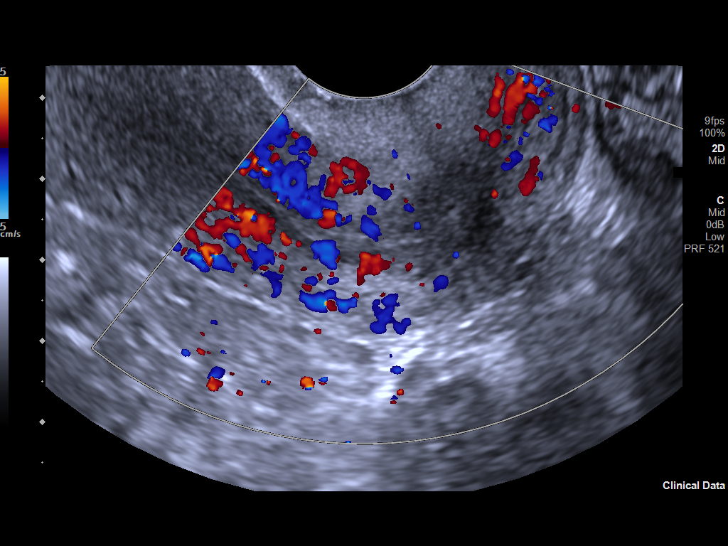
[im 60/143]
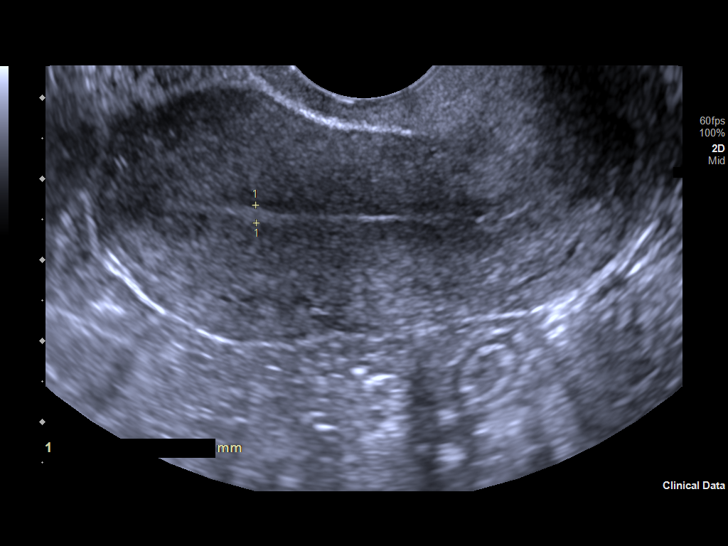
[im 72/143]
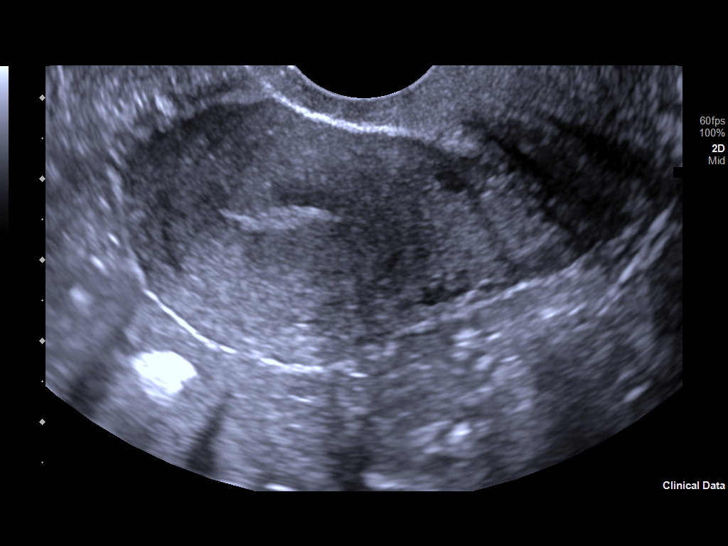
[im 83/143]
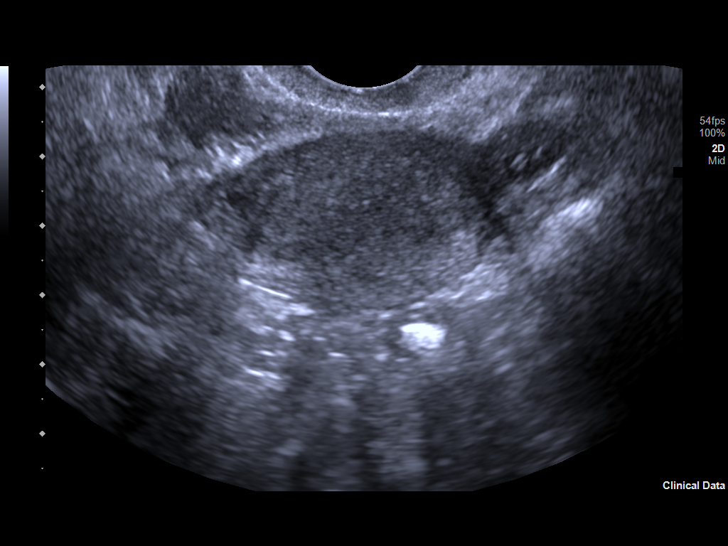
[im 95/143]
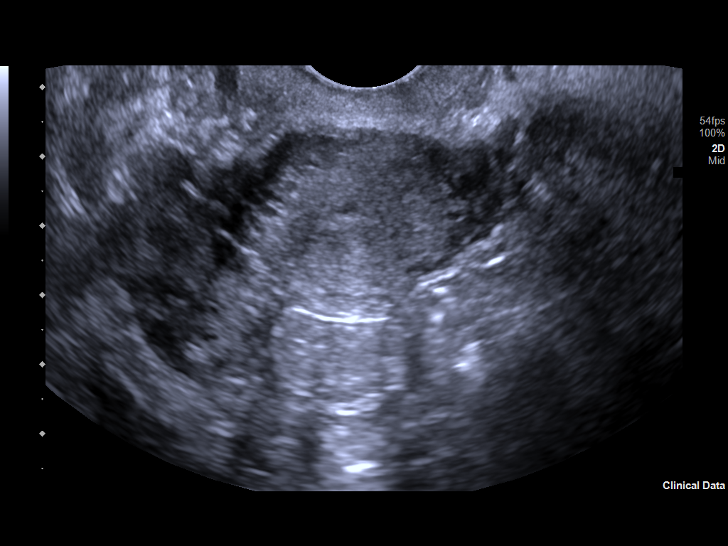
[im 107/143]
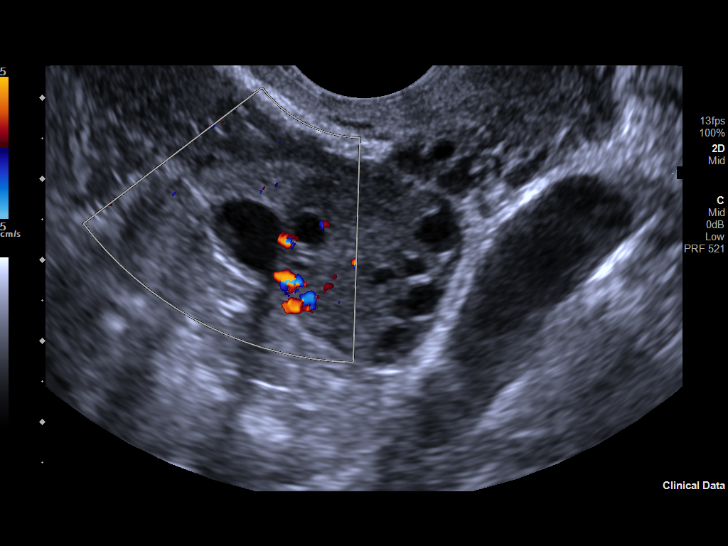
[im 119/143]
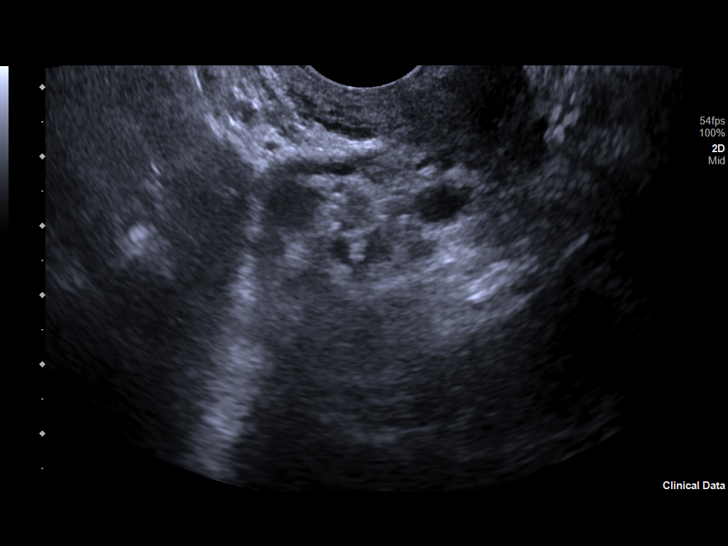
[im 131/143]
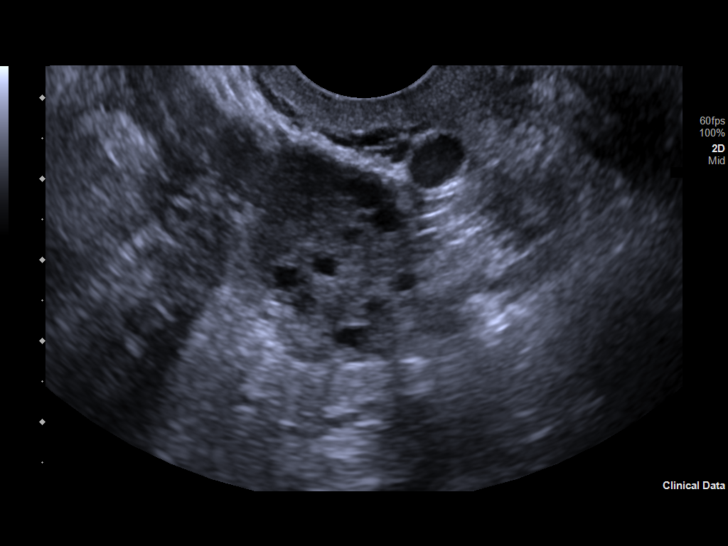
[im 143/143]
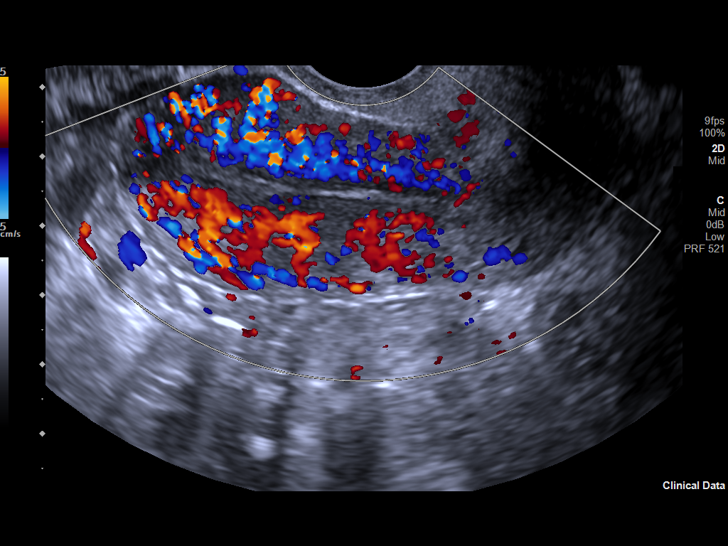

[13 of 25 positions shown; findings below may reference images not displayed]

FINDINGS: Uterus

Measurements: 6.6 x 3.1 x 3.9 cm = volume: 42.6 mL. Uterus is
anteverted. No discrete fibroid or other mass. Increased vascularity
seen diffusely throughout the myometrium.

Endometrium

Thickness: 2.2 mm.  No focal abnormality visualized.

Right ovary

Measurements: 3.3 x 2.3 x 2.6 cm = volume: 10.3 mL. Normal
appearance/no adnexal mass.

Left ovary

Measurements: 3.2 x 2.0 x 2.5 cm = volume: 8.1 mL. Normal
appearance/no adnexal mass.

Pulsed Doppler evaluation of both ovaries demonstrates normal
low-resistance arterial and venous waveforms.

Other findings

No abnormal free fluid.
IMPRESSION: 1. Negative pelvic ultrasound with no evidence for torsion or other
acute abnormality.
2. Endometrial stripe within normal limits measuring 2.2 mm in
thickness. If bleeding remains unresponsive to hormonal or medical
therapy, sonohysterogram should be considered for focal lesion
work-up. (Ref: Radiological Reasoning: Algorithmic Workup of
Abnormal Vaginal Bleeding with Endovaginal Sonography and
Sonohysterography. AJR 1331; 191:S68-73).

## 2024-01-27 ENCOUNTER — Encounter (HOSPITAL_BASED_OUTPATIENT_CLINIC_OR_DEPARTMENT_OTHER): Payer: Self-pay

## 2024-01-27 ENCOUNTER — Emergency Department (HOSPITAL_BASED_OUTPATIENT_CLINIC_OR_DEPARTMENT_OTHER)

## 2024-01-27 ENCOUNTER — Other Ambulatory Visit: Payer: Self-pay

## 2024-01-27 ENCOUNTER — Emergency Department (HOSPITAL_BASED_OUTPATIENT_CLINIC_OR_DEPARTMENT_OTHER)
Admission: EM | Admit: 2024-01-27 | Discharge: 2024-01-27 | Disposition: A | Discharge status: Home / Self Care | Attending: Emergency Medicine | Admitting: Emergency Medicine

## 2024-01-27 DIAGNOSIS — R112 Nausea with vomiting, unspecified: Secondary | ICD-10-CM | POA: Diagnosis present

## 2024-01-27 DIAGNOSIS — K529 Noninfective gastroenteritis and colitis, unspecified: Secondary | ICD-10-CM | POA: Insufficient documentation

## 2024-01-27 LAB — COMPREHENSIVE METABOLIC PANEL
ALT: 25 U/L (ref 0–44)
AST: 19 U/L (ref 15–41)
Albumin: 4.1 g/dL (ref 3.5–5.0)
Alkaline Phosphatase: 41 U/L (ref 38–126)
Anion gap: 8 (ref 5–15)
BUN: 9 mg/dL (ref 6–20)
CO2: 22 mmol/L (ref 22–32)
Calcium: 8.9 mg/dL (ref 8.9–10.3)
Chloride: 104 mmol/L (ref 98–111)
Creatinine, Ser: 0.58 mg/dL (ref 0.44–1.00)
GFR, Estimated: >60 mL/min (ref >60)
Glucose, Bld: 75 mg/dL (ref 70–99)
Potassium: 3.5 mmol/L (ref 3.5–5.1)
Sodium: 134 mmol/L — ABNORMAL LOW (ref 135–145)
Total Bilirubin: 0.8 mg/dL (ref 0.0–1.2)
Total Protein: 6.9 g/dL (ref 6.5–8.1)

## 2024-01-27 LAB — URINALYSIS, ROUTINE W REFLEX MICROSCOPIC
Bacteria, UA: NONE SEEN
Bilirubin Urine: NEGATIVE
Glucose, UA: NEGATIVE mg/dL
Hgb urine dipstick: NEGATIVE
Leukocytes,Ua: NEGATIVE
Nitrite: NEGATIVE
Protein, ur: NEGATIVE mg/dL
Specific Gravity, Urine: 1.016 (ref 1.005–1.030)
pH: 6.5 (ref 5.0–8.0)

## 2024-01-27 LAB — CBC
HCT: 33.9 % — ABNORMAL LOW (ref 36.0–46.0)
Hemoglobin: 11.1 g/dL — ABNORMAL LOW (ref 12.0–15.0)
MCH: 28.4 pg (ref 26.0–34.0)
MCHC: 32.7 g/dL (ref 30.0–36.0)
MCV: 86.7 fL (ref 80.0–100.0)
Platelets: 239 10*3/uL (ref 150–400)
RBC: 3.91 MIL/uL (ref 3.87–5.11)
RDW: 14.6 % (ref 11.5–15.5)
WBC: 4.8 10*3/uL (ref 4.0–10.5)
nRBC: 0 % (ref 0.0–0.2)

## 2024-01-27 LAB — PREGNANCY, URINE: Preg Test, Ur: NEGATIVE

## 2024-01-27 LAB — LIPASE, BLOOD: Lipase: 11 U/L (ref 11–51)

## 2024-01-27 MED ORDER — ONDANSETRON 4 MG PO TBDP
4.0000 mg | ORAL_TABLET | Freq: Once | ORAL | Status: AC
Start: 2024-01-27 — End: 2024-01-27
  Administered 2024-01-27: 4 mg via ORAL
  Filled 2024-01-27: qty 1

## 2024-01-27 MED ORDER — IOHEXOL 300 MG/ML  SOLN
120.0000 mL | Freq: Once | INTRAMUSCULAR | Status: AC | PRN
  Administered 2024-01-27: 120 mL via INTRAVENOUS

## 2024-01-27 MED ORDER — PROCHLORPERAZINE MALEATE 10 MG PO TABS
10.0000 mg | ORAL_TABLET | Freq: Two times a day (BID) | ORAL | 0 refills | Status: AC | PRN
Start: 2024-01-27 — End: ?

## 2024-01-27 MED ORDER — DICYCLOMINE HCL 20 MG PO TABS: 20.0000 mg | ORAL_TABLET | Freq: Two times a day (BID) | ORAL | 0 refills | Status: AC

## 2024-01-27 MED ORDER — FENTANYL CITRATE PF 50 MCG/ML IJ SOSY
50.0000 ug | PREFILLED_SYRINGE | Freq: Once | INTRAMUSCULAR | Status: AC
  Administered 2024-01-27: 50 ug via INTRAVENOUS
  Filled 2024-01-27: qty 1

## 2024-01-27 NOTE — ED Provider Notes (Signed)
 [place] EMERGENCY DEPARTMENT AT [facility] Provider Note   CSN: [id number] Arrival date & time: 01/27/24  1946     History  Chief Complaint  Patient presents with   Vomiting    Katherine Dawson is a 23 y.o. female presenting to the ER today with complaints of nausea and vomiting after eating a chicken and steak fajita the night before She states that this has been going on for the last 17 hours. Pt has had approx 10 episodes of emesis in total and is not tolerating PO intake. She denies hematemesis, fever, or chills. No known sick contacts. She is concerned that this may be d/t food poisoning. Pt is also complaining of intermittent RUQ and epigastric pain but she denies abdominal cramping or bloating, diarrhea, melena or hematochezia.   HPI     Home Medications Prior to Admission medications   Medication Sig Start Date End Date Taking? Authorizing Provider  acetaminophen (TYLENOL) 500 MG tablet Take 1,000 mg by mouth every 6 (six) hours as needed for moderate pain or headache.    [provider]  diphenhydrAMINE (BENADRYL) 25 MG tablet Take 1 tablet (25 mg total) by mouth every 6 (six) hours. Patient not taking: Reported on 04/18/2022 06/26/18   Roxy Horseman, PA-C  famotidine (PEPCID) 20 MG tablet Take 1 tablet (20 mg total) by mouth 2 (two) times daily. Patient not taking: Reported on 04/18/2022 06/26/18   Roxy Horseman, PA-C      Allergies    Patient has no known allergies.    Review of Systems   Review of Systems  Physical Exam Updated Vital Signs BP (!) 102/53   Pulse 92   Temp 98.8 F (37.1 C)   Resp 16   Ht 5\' 6"  (1.676 m)   Wt 122.5 kg   LMP 01/27/2024 (Exact Date)   SpO2 99%   BMI 43.58 kg/m  Physical Exam Vitals and nursing note reviewed.  Constitutional:      General: She is not in acute distress.    Appearance: She is well-developed. She is not diaphoretic.  HENT:     Head: Normocephalic and atraumatic.     Right Ear: External  ear normal.     Left Ear: External ear normal.     Nose: Nose normal.     Mouth/Throat:     Mouth: Mucous membranes are moist.  Eyes:     General: No scleral icterus.    Conjunctiva/sclera: Conjunctivae normal.  Cardiovascular:     Rate and Rhythm: Normal rate and regular rhythm.     Heart sounds: Normal heart sounds. No murmur heard.    No friction rub. No gallop.  Pulmonary:     Effort: Pulmonary effort is normal. No respiratory distress.     Breath sounds: Normal breath sounds.  Abdominal:     General: Bowel sounds are normal. There is no distension.     Palpations: Abdomen is soft. There is no mass.     Tenderness: There is abdominal tenderness in the right upper quadrant. There is guarding.  Musculoskeletal:     Cervical back: Normal range of motion.  Skin:    General: Skin is warm and dry.  Neurological:     Mental Status: She is alert and oriented to person, place, and time.  Psychiatric:        Behavior: Behavior normal.     ED Results / Procedures / Treatments   Labs (all labs ordered are listed, but only abnormal results  are displayed) Labs Reviewed  CBC - Abnormal; Notable for the following components:      Result Value   Hemoglobin 11.1 (*)    HCT 33.9 (*)    All other components within normal limits  PREGNANCY, URINE  LIPASE, BLOOD  COMPREHENSIVE METABOLIC PANEL  URINALYSIS, ROUTINE W REFLEX MICROSCOPIC    EKG None  Radiology No results found.  Procedures Procedures    Medications Ordered in ED Medications  ondansetron (ZOFRAN-ODT) disintegrating tablet 4 mg (4 mg Oral Given 01/27/24 2012)    ED Course/ Medical Decision Making/ A&P                                 Medical Decision Making Amount and/or Complexity of Data Reviewed Labs: ordered. Radiology: ordered.  Risk Prescription drug management.   This patient presents to the ED with chief complaint(s) of epigastric abdominal pain and vomiting The complaint involves an extensive  differential diagnosis and treatment options and also carries with it a high risk of complications and morbidity.    The emergent differential diagnosis for vomiting includes, but is not limited to ACS/MI, DKA, Ischemic bowel, Meningitis, Sepsis, Acute gastric dilation, Adrenal insufficiency, Appendicitis,  Bowel obstruction/ileus, Carbon monoxide poisoning, Cholecystitis, Electrolyte abnormalities, Elevated ICP, Gastric outlet obstruction, Pancreatitis, Ruptured viscus, Biliary colic, Cannabinoid hyperemesis syndrome, Gastritis, Gastroenteritis, Gastroparesis,  Narcotic withdrawal, Peptic ulcer disease, and UTI    The initial plan is to order labs and imaging and to treat patient with Zofran and pain medication for vomiting and abdominal pain  Additional Tests and treatment considered: I considered a right upper quadrant ultrasound however we do not have it available at this time   Reassessment and review (also see workup area): Lab Tests: I Ordered, and personally interpreted labs.  The pertinent results include:   I reviewed the patient's labs.  Hemoglobin with mild anemia of insignificant value, remainder of labs are unremarkable.  Imaging Studies: I ordered and independently visualized and interpreted the following imaging CT abdomen and pelvis which showed mild dilated loops of bowel consistent with enteritis The interpretation of the imaging was limited to assessing for emergent pathology, for which purpose it was ordered.   Medicines ordered and prescription drug management: I ordered the following medications Fentanyl and Zofran for pain and vomiting    Reevaluation of the patient after these medicines showed that the patient    improved      Complexity of problems addressed: Patient's presentation is most consistent with  acute illness/injury with systemic symptoms During patient's assessment  Disposition: After consideration of the diagnostic results and the patient's  response to treatment,  I feel that the patent would benefit from discharge with Compazine and Bentyl .          Final Clinical Impression(s) / ED Diagnoses Final diagnoses:  Enteritis    Rx / DC Orders ED Discharge Orders     None         Arthor Captain, PA-C 01/27/24 2322    Elayne Snare K, DO 01/27/24 2354

## 2024-01-27 NOTE — Discharge Instructions (Addendum)
 Your CT Scan showed enteritis. This is viral and passes within the next 24 to 48 hours.  I will be discharging with some medications to help with nausea and vomiting as well as abdominal pain and cramping.  You may also take Tylenol and use a hot pack on your abdomen which we can also help.  You can also use over-the-counter Pepcid which helps reduce acid in your stomach.  Contact a health care provider if you: Cannot keep fluids down. Have symptoms that get worse. Have new symptoms. Feel light-headed or dizzy. Have muscle cramps. Get help right away if you: Have chest pain. Have trouble breathing or you are breathing very quickly. Have a fast heartbeat. Feel extremely weak or you faint. Have a severe headache, a stiff neck, or both. Have a rash. Have severe pain, cramping, or bloating in your abdomen. Have skin that feels cold and clammy. Feel confused. Have pain when you urinate. Have signs of dehydration, such as: Dark urine, very little urine, or no urine. Cracked lips. Dry mouth. Sunken eyes. Sleepiness. Weakness. Have signs of bleeding, such as: Seeing blood in your vomit. Having vomit that looks like coffee grounds. Having bloody or black stools or stools that look like tar. These symptoms may be an emergency. Get help right away. Call 911. Do not wait to see if the symptoms will go away. Do not drive yourself to the hospital.

## 2024-01-27 NOTE — ED Triage Notes (Signed)
 Says she began vomiting last night while visiting [facility] [employer].   Suspects food poisoning.   Guesstimates 7 episodes of vomiting. Last time 2 hours ago.
